# Patient Record
Sex: Female | Born: 1940 | Race: White | Hispanic: No | Marital: Married | Smoking: Former smoker
Health system: Southern US, Community
[De-identification: ages and names within clinical notes are randomized; demographics above are authoritative.]

## PROBLEM LIST (undated history)

## (undated) DIAGNOSIS — J449 Chronic obstructive pulmonary disease, unspecified: Secondary | ICD-10-CM

## (undated) DIAGNOSIS — I10 Essential (primary) hypertension: Secondary | ICD-10-CM

## (undated) HISTORY — DX: Essential (primary) hypertension: I10

---

## 2017-03-03 ENCOUNTER — Other Ambulatory Visit: Payer: Self-pay | Admitting: Internal Medicine

## 2017-03-03 DIAGNOSIS — Z1231 Encounter for screening mammogram for malignant neoplasm of breast: Secondary | ICD-10-CM

## 2017-03-11 ENCOUNTER — Ambulatory Visit (INDEPENDENT_AMBULATORY_CARE_PROVIDER_SITE_OTHER)
Admission: RE | Admit: 2017-03-11 | Discharge: 2017-03-11 | Disposition: A | Discharge status: Home / Self Care | Payer: Medicare Other | Source: Ambulatory Visit | Attending: Pulmonary Disease | Admitting: Pulmonary Disease

## 2017-03-11 ENCOUNTER — Encounter: Payer: Self-pay | Admitting: Pulmonary Disease

## 2017-03-11 ENCOUNTER — Ambulatory Visit (INDEPENDENT_AMBULATORY_CARE_PROVIDER_SITE_OTHER): Payer: Medicare Other | Admitting: Pulmonary Disease

## 2017-03-11 VITALS — BP 114/70 | HR 57 | Ht 63.0 in | Wt 162.0 lb

## 2017-03-11 DIAGNOSIS — J449 Chronic obstructive pulmonary disease, unspecified: Secondary | ICD-10-CM | POA: Diagnosis not present

## 2017-03-11 MED ORDER — ALBUTEROL SULFATE HFA 108 (90 BASE) MCG/ACT IN AERS: 2.0000 | INHALATION_SPRAY | Freq: Four times a day (QID) | RESPIRATORY_TRACT | 2 refills | Status: DC | PRN

## 2017-03-11 NOTE — Assessment & Plan Note (Signed)
Lung function is decreased at 40% Referral to pulmonary rehabilitation  Finish trelegy , then start Sample of ANORO  Albuterol MDI 2 puffs as needed before exercise  CXR today

## 2017-03-11 NOTE — Addendum Note (Signed)
Addended by: Maurene Capes on: 03/11/2017 04:12 PM   Modules accepted: Orders

## 2017-03-11 NOTE — Addendum Note (Signed)
Addended by: Maurene Capes on: 03/11/2017 11:06 AM   Modules accepted: Orders

## 2017-03-11 NOTE — Progress Notes (Signed)
Subjective:    Patient ID: Lisa Kaufman, female    DOB: 08/01/1941, 76 y.o.   MRN: [id number]  HPI  Chief Complaint  Patient presents with  . Pulm Consult    Referred by Dr. Wylene Simmer for COPD. Was given Trelegy but insurance will not pay for it. States she has SOB only she gets worried or anxious.     76 year old ex-smoker referred for evaluation of shortness of breath. She reports a diagnosis of COPD made in 2005 by her PCP in [place] where she did undergo spirometry. She reports dyspnea on exertion for many years that is stable, she states that she does not do much, she is able to walk around the house and in the grocery store but cannot climb stairs or walk for extended duration and keep up with her husband. She reports smoking a pack per day since her 46s until she quit in 2005, about 40 pack years. She reports occasional wheezing but denies frequent chest colds. She has never had an attack of the flu or pneumonia. She reports occasional dry cough but denies sputum production. She denies chest pain, orthopnea paroxysmal nocturnal dyspnea or pedal edema.  She was given a sample of trelegy by her PCP which is not covered by her insurance. She has used this for about a week and is not sure that this helps her breathing. She also takes singular at night but denies a diagnosis of asthma or denies seasonal allergies   Spirometry showed severe airway obstruction ratio 40, FEV1 of 40% and FVC of 76%    Past Medical History:  Diagnosis Date  . HTN (hypertension)    No past surgical history on file.   No Known Allergies  Social History   Social History  . Marital status: Married    Spouse name: N/A  . Number of children: N/A  . Years of education: N/A   Occupational History  . Not on file.   Social History Main Topics  . Smoking status: Former Smoker    Types: Cigarettes    Quit date: 11/18/2003  . Smokeless tobacco: Never Used  . Alcohol use No  . Drug use: No  .  Sexual activity: Not on file   Other Topics Concern  . Not on file   Social History Narrative  . No narrative on file     Review of Systems Constitutional: negative for anorexia, fevers and sweats  Eyes: negative for irritation, redness and visual disturbance  Ears, nose, mouth, throat, and face: negative for earaches, epistaxis, nasal congestion and sore throat  Respiratory: negative for cough,  sputum and wheezing  Cardiovascular: negative for chest pain,  lower extremity edema, orthopnea, palpitations and syncope  Gastrointestinal: negative for abdominal pain, constipation, diarrhea, melena, nausea and vomiting  Genitourinary:negative for dysuria, frequency and hematuria  Hematologic/lymphatic: negative for bleeding, easy bruising and lymphadenopathy  Musculoskeletal:negative for arthralgias, muscle weakness and stiff joints  Neurological: negative for coordination problems, gait problems, headaches and weakness  Endocrine: negative for diabetic symptoms including polydipsia, polyuria and weight loss     Objective:   Physical Exam  Gen. Pleasant, well-nourished, in no distress, normal affect ENT - no lesions, no post nasal drip Neck: No JVD, no thyromegaly, no carotid bruits Lungs: no use of accessory muscles, no dullness to percussion, clear without rales or rhonchi  Cardiovascular: Rhythm regular, heart sounds  normal, no murmurs or gallops, no peripheral edema Abdomen: soft and non-tender, no hepatosplenomegaly, BS normal. Musculoskeletal: No deformities, no  cyanosis or clubbing Neuro:  alert, non focal       Assessment & Plan:

## 2017-03-11 NOTE — Patient Instructions (Addendum)
Lung function is decreased at 40% Referral to pulmonary rehabilitation  Finish trelegy , then start Sample of ANORO  Albuterol MDI 2 puffs as needed before exercise  CXR today 

## 2017-03-16 ENCOUNTER — Telehealth: Payer: Self-pay | Admitting: Pulmonary Disease

## 2017-03-16 DIAGNOSIS — R0602 Shortness of breath: Secondary | ICD-10-CM

## 2017-03-16 NOTE — Telephone Encounter (Signed)
Left a message with the front staff for Lisa Kaufman to call us back

## 2017-03-16 NOTE — Telephone Encounter (Signed)
Portia from pulmonary rehab [phone], can call tomorrow..patient needs a new order with a different diagnoses, no PSTs.Charm Rings

## 2017-03-16 NOTE — Telephone Encounter (Signed)
Will call tomorrow 

## 2017-03-17 NOTE — Telephone Encounter (Signed)
Left message for Lisa Kaufman to call back.

## 2017-03-18 NOTE — Telephone Encounter (Signed)
Called pulmonary rehab and they are always off on wednesdays.  Will try back on thursday

## 2017-03-19 NOTE — Telephone Encounter (Signed)
Called and spoke to MascottePortia with pulmonary rehab. A new order is needing placed d/t pt not having PFTs, new order placed. Portia verbalized understanding and denied any further questions or concerns at this time.

## 2017-03-30 ENCOUNTER — Ambulatory Visit
Admission: RE | Admit: 2017-03-30 | Discharge: 2017-03-30 | Disposition: A | Discharge status: Home / Self Care | Payer: Medicare Other | Source: Ambulatory Visit | Attending: Internal Medicine | Admitting: Internal Medicine

## 2017-03-30 DIAGNOSIS — Z1231 Encounter for screening mammogram for malignant neoplasm of breast: Secondary | ICD-10-CM

## 2017-04-07 ENCOUNTER — Other Ambulatory Visit: Payer: Self-pay | Admitting: Internal Medicine

## 2017-04-07 DIAGNOSIS — R928 Other abnormal and inconclusive findings on diagnostic imaging of breast: Secondary | ICD-10-CM

## 2017-04-09 ENCOUNTER — Other Ambulatory Visit: Payer: Self-pay | Admitting: Internal Medicine

## 2017-04-09 ENCOUNTER — Ambulatory Visit
Admission: RE | Admit: 2017-04-09 | Discharge: 2017-04-09 | Disposition: A | Discharge status: Home / Self Care | Payer: Medicare Other | Source: Ambulatory Visit | Attending: Internal Medicine | Admitting: Internal Medicine

## 2017-04-09 DIAGNOSIS — R921 Mammographic calcification found on diagnostic imaging of breast: Secondary | ICD-10-CM

## 2017-04-09 DIAGNOSIS — R928 Other abnormal and inconclusive findings on diagnostic imaging of breast: Secondary | ICD-10-CM

## 2017-04-14 ENCOUNTER — Ambulatory Visit: Payer: Medicare Other | Admitting: Adult Health

## 2017-05-04 ENCOUNTER — Ambulatory Visit (INDEPENDENT_AMBULATORY_CARE_PROVIDER_SITE_OTHER): Payer: Medicare Other | Admitting: Adult Health

## 2017-05-04 ENCOUNTER — Encounter: Payer: Self-pay | Admitting: Adult Health

## 2017-05-04 DIAGNOSIS — J449 Chronic obstructive pulmonary disease, unspecified: Secondary | ICD-10-CM | POA: Diagnosis not present

## 2017-05-04 MED ORDER — ALBUTEROL SULFATE HFA 108 (90 BASE) MCG/ACT IN AERS: 2.0000 | INHALATION_SPRAY | Freq: Four times a day (QID) | RESPIRATORY_TRACT | 2 refills | Status: DC | PRN

## 2017-05-04 NOTE — Addendum Note (Signed)
Addended by: Boone MasterJONES, Delonta Yohannes E on: 05/04/2017 03:50 PM   Modules accepted: Orders

## 2017-05-04 NOTE — Progress Notes (Signed)
@Patient  ID: Lisa Kaufman, female    DOB: [birth year], 76 y.o.   MRN: [id number]  Chief Complaint  Patient presents with  . Follow-up    COPD     Referring provider: No ref. provider found  HPI: 76 year old female former smoker followed for COPD seen for pulmonary consult 03/11/2017  TEST  02/2017 >Spirometry showed severe airway obstruction ratio 40, FEV1 of 40% and FVC of 76%  05/04/2017 Follow up : COPD  Pt returns for 2 month follow up . Seen last ov to establish for COPD . Spirometry showed Severe COPD with FEV1 40% , ratio 40 .  She feels she is doing okay . No flare of cough or wheezing . Gets winded with activiies and has to rest sometimes.   She remains on TRELEGY . Was going to change to [facility] - does not think it was covered.  CXR showed COPD changes.  She was referred to pulmonary rehab. She could not afford the co-pays .  We discussed healthy activities.   She is on ACE inhibitor . Does have raspy voice and dry cough intermittently .   PVX Hulen Luster are utd.    No Known Allergies  Immunization History  Administered Date(s) Administered  . Influenza Whole 08/17/2016    Past Medical History:  Diagnosis Date  . HTN (hypertension)     Tobacco History: History  Smoking Status  . Former Smoker  . Types: Cigarettes  . Quit date: 11/18/2003  Smokeless Tobacco  . Never Used   Counseling given: Not Answered   Outpatient Encounter Prescriptions as of 05/04/2017  Medication Sig  . Fluticasone-Umeclidin-Vilant (TRELEGY ELLIPTA) 100-62.5-25 MCG/INH AEPB Inhale 1 puff into the lungs.  Marland Kitchen lisinopril-hydrochlorothiazide (PRINZIDE,ZESTORETIC) 20-12.5 MG tablet Take 1 tablet by mouth daily.  . montelukast (SINGULAIR) 10 MG tablet 1 tablet.  Marland Kitchen albuterol (PROVENTIL HFA;VENTOLIN HFA) 108 (90 Base) MCG/ACT inhaler Inhale 2 puffs into the lungs every 6 (six) hours as needed for wheezing or shortness of breath. (Patient not taking: Reported on 05/04/2017)  . alendronate  (FOSAMAX) 70 MG tablet 1 tablet.   No facility-administered encounter medications on file as of 05/04/2017.      Review of Systems  Constitutional:   No  weight loss, night sweats,  Fevers, chills, fatigue, or  lassitude.  HEENT:   No headaches,  Difficulty swallowing,  Tooth/dental problems, or  Sore throat,                No sneezing, itching, ear ache, nasal congestion, post nasal drip,   CV:  No chest pain,  Orthopnea, PND, swelling in lower extremities, anasarca, dizziness, palpitations, syncope.   GI  No heartburn, indigestion, abdominal pain, nausea, vomiting, diarrhea, change in bowel habits, loss of appetite, bloody stools.   Resp:    No chest wall deformity  Skin: no rash or lesions.  GU: no dysuria, change in color of urine, no urgency or frequency.  No flank pain, no hematuria   MS:  No joint pain or swelling.  No decreased range of motion.  No back pain.    Physical Exam  BP 114/70 (BP Location: Left Arm, Cuff Size: Normal)   Pulse 63   Ht 5\' 1"  (1.549 m)   Wt 131 lb (59.4 kg)   SpO2 95%   BMI 24.75 kg/m   GEN: A/Ox3; pleasant , NAD, elderly    HEENT:  [place]/AT,  EACs-clear, TMs-wnl, NOSE-clear, THROAT-clear, no lesions, no postnasal drip or exudate noted.   NECK:  Supple w/ fair ROM; no JVD; normal carotid impulses w/o bruits; no thyromegaly or nodules palpated; no lymphadenopathy.    RESP  Decreased BS in bases ,  no accessory muscle use, no dullness to percussion  CARD:  RRR, no m/r/g, no peripheral edema, pulses intact, no cyanosis or clubbing.  GI:   Soft & nt; nml bowel sounds; no organomegaly or masses detected.   Musco: Warm bil, no deformities or joint swelling noted.   Neuro: alert, no focal deficits noted.    Skin: Warm, no lesions or rashes    Lab Results:  CBC No results found for: WBC, RBC, HGB, HCT, PLT, MCV, MCH, MCHC, RDW, LYMPHSABS, MONOABS, EOSABS, BASOSABS  BMET No results found for: NA, K, CL, CO2, GLUCOSE, BUN, CREATININE,  CALCIUM, GFRNONAA, GFRAA  BNP No results found for: BNP  ProBNP No results found for: PROBNP  Imaging: Mm Digital Diagnostic Unilat L  Result Date: 04/09/2017 CLINICAL DATA:  Screening recall for left breast calcifications. EXAM: DIGITAL DIAGNOSTIC LEFT MAMMOGRAM COMPARISON:  Previous exam(s). ACR Breast Density Category b: There are scattered areas of fibroglandular density. FINDINGS: Spot compression magnification views were performed over the upper-outer posterior left breast demonstrating a 3-4 group of coarse likely early dystrophic type calcifications. IMPRESSION: Probably benign left breast calcifications. RECOMMENDATION: Diagnostic mammography of the left breast with magnification views in 6 months. I have discussed the findings and recommendations with the patient. Results were also provided in writing at the conclusion of the visit. If applicable, a reminder letter will be sent to the patient regarding the next appointment. BI-RADS CATEGORY  3: Probably benign. Electronically Signed   By: Edwin CapJennifer  Jarosz M.D.   On: 04/09/2017 10:41     Assessment & Plan:   COPD (chronic obstructive pulmonary disease) (HCC) Severe COPD , compensated on TRELEGY  Pulmonary rehab would be beneficial but uanble to afford.  Advised on healthy activities.  PvX and Prevnar vaccines utd.  She does have intermittent cough , would use caution with ACE inhibitor if cough flares.   Plan  Patient Instructions  Continue on TRELEGY 1 puff daily , rinse after use.  Discuss with Primary MD that Lisinopril may aggravate your cough.  Use Ventolin 2 puffs every 4hrs as needed or before exercise as needed- this is your rescue inhaler.  Follow up Dr. Vassie LollAlva  In 4-6 months and As needed   Please contact office for sooner follow up if symptoms do not improve or worsen or seek emergency care         Rubye Oaksammy Jadae Steinke, NP 05/04/2017

## 2017-05-04 NOTE — Assessment & Plan Note (Addendum)
Severe COPD , compensated on TRELEGY  Pulmonary rehab would be beneficial but uanble to afford.  Advised on healthy activities.  PvX and Prevnar vaccines utd.  She does have intermittent cough , would use caution with ACE inhibitor if cough flares.   Plan  Patient Instructions  Continue on TRELEGY 1 puff daily , rinse after use.  Discuss with Primary MD that Lisinopril may aggravate your cough.  Use Ventolin 2 puffs every 4hrs as needed or before exercise as needed- this is your rescue inhaler.  Follow up Dr. Vassie LollAlva  In 4-6 months and As needed   Please contact office for sooner follow up if symptoms do not improve or worsen or seek emergency care

## 2017-05-04 NOTE — Patient Instructions (Signed)
Continue on TRELEGY 1 puff daily , rinse after use.  Discuss with Primary MD that Lisinopril may aggravate your cough.  Use Ventolin 2 puffs every 4hrs as needed or before exercise as needed- this is your rescue inhaler.  Follow up Dr. Vassie LollAlva  In 4-6 months and As needed   Please contact office for sooner follow up if symptoms do not improve or worsen or seek emergency care

## 2017-05-11 NOTE — Progress Notes (Signed)
Reviewed & agree with plan  

## 2017-09-07 ENCOUNTER — Ambulatory Visit: Payer: Medicare Other | Admitting: Pulmonary Disease

## 2017-09-14 ENCOUNTER — Ambulatory Visit (INDEPENDENT_AMBULATORY_CARE_PROVIDER_SITE_OTHER): Payer: Medicare Other | Admitting: Pulmonary Disease

## 2017-09-14 ENCOUNTER — Encounter: Payer: Self-pay | Admitting: Pulmonary Disease

## 2017-09-14 DIAGNOSIS — J449 Chronic obstructive pulmonary disease, unspecified: Secondary | ICD-10-CM

## 2017-09-14 DIAGNOSIS — I10 Essential (primary) hypertension: Secondary | ICD-10-CM

## 2017-09-14 MED ORDER — FLUTICASONE-UMECLIDIN-VILANT 100-62.5-25 MCG/INH IN AEPB: 1.0000 | INHALATION_SPRAY | Freq: Every day | RESPIRATORY_TRACT | 0 refills | Status: DC

## 2017-09-14 NOTE — Progress Notes (Signed)
   Subjective:    Patient ID: Lisa Kaufman, female    DOB: 1941-03-10, 76 y.o.   MRN: [id number]  HPI 76 year old female former smoker followed for COPD   She remains on trelegy but is in the donut hole and this is very expensive. She was unable to afford rehab due to high co-pays  Breathing remains bad and she can feel it when she has to walk even on level ground for long distances She continues to have throat clearing, but a week ago lisinopril was stopped and changed to losartan  Significant tests/ events reviewed  02/2017 >Spirometry showed severe airway obstruction ratio 40, FEV1 of 40% and FVC of 76%   Past Medical History:  Diagnosis Date  . HTN (hypertension)      Review of Systems neg for any significant sore throat, dysphagia, itching, sneezing, nasal congestion or excess/ purulent secretions, fever, chills, sweats, unintended wt loss, pleuritic or exertional cp, hempoptysis, orthopnea pnd or change in chronic leg swelling. Also denies presyncope, palpitations, heartburn, abdominal pain, nausea, vomiting, diarrhea or change in bowel or urinary habits, dysuria,hematuria, rash, arthralgias, visual complaints, headache, numbness weakness or ataxia.     Objective:   Physical Exam  Gen. Pleasant, well-nourished, in no distress ENT - no thrush, no post nasal drip Neck: No JVD, no thyromegaly, no carotid bruits Lungs: no use of accessory muscles, no dullness to percussion, decreased  without rales or rhonchi  Cardiovascular: Rhythm regular, heart sounds  normal, no murmurs or gallops, no peripheral edema Musculoskeletal: No deformities, no cyanosis or clubbing        Assessment & Plan:

## 2017-09-14 NOTE — Assessment & Plan Note (Signed)
Changed to losartan 1 week ago

## 2017-09-14 NOTE — Addendum Note (Signed)
Addended by: Maurene CapesPOTTS, Ritu Gagliardo M on: 09/14/2017 04:39 PM   Modules accepted: Orders

## 2017-09-14 NOTE — Assessment & Plan Note (Signed)
Ct Trelegy  Walking for 30 mins daily Call if you develop  -change in color of sputum. -increased wheezing - increased use of albuterol

## 2017-09-14 NOTE — Patient Instructions (Signed)
Walking for 30 mins daily Call if you develop  -change in color of sputum. -increased wheezing - increased use of albuterol

## 2017-10-12 ENCOUNTER — Ambulatory Visit
Admission: RE | Admit: 2017-10-12 | Discharge: 2017-10-12 | Disposition: A | Discharge status: Home / Self Care | Payer: Medicare Other | Source: Ambulatory Visit | Attending: Internal Medicine | Admitting: Internal Medicine

## 2017-10-12 ENCOUNTER — Other Ambulatory Visit: Payer: Self-pay | Admitting: Internal Medicine

## 2017-10-12 DIAGNOSIS — R921 Mammographic calcification found on diagnostic imaging of breast: Secondary | ICD-10-CM

## 2017-11-23 DIAGNOSIS — H5213 Myopia, bilateral: Secondary | ICD-10-CM | POA: Diagnosis not present

## 2017-11-23 DIAGNOSIS — H2513 Age-related nuclear cataract, bilateral: Secondary | ICD-10-CM | POA: Diagnosis not present

## 2017-12-09 DIAGNOSIS — H2511 Age-related nuclear cataract, right eye: Secondary | ICD-10-CM | POA: Diagnosis not present

## 2017-12-09 DIAGNOSIS — H2512 Age-related nuclear cataract, left eye: Secondary | ICD-10-CM | POA: Diagnosis not present

## 2017-12-09 DIAGNOSIS — H25812 Combined forms of age-related cataract, left eye: Secondary | ICD-10-CM | POA: Diagnosis not present

## 2017-12-23 DIAGNOSIS — H2511 Age-related nuclear cataract, right eye: Secondary | ICD-10-CM | POA: Diagnosis not present

## 2017-12-23 DIAGNOSIS — H25811 Combined forms of age-related cataract, right eye: Secondary | ICD-10-CM | POA: Diagnosis not present

## 2017-12-23 DIAGNOSIS — H2512 Age-related nuclear cataract, left eye: Secondary | ICD-10-CM | POA: Diagnosis not present

## 2018-01-13 DIAGNOSIS — M25552 Pain in left hip: Secondary | ICD-10-CM | POA: Diagnosis not present

## 2018-01-13 DIAGNOSIS — J449 Chronic obstructive pulmonary disease, unspecified: Secondary | ICD-10-CM | POA: Diagnosis not present

## 2018-01-13 DIAGNOSIS — Z6826 Body mass index (BMI) 26.0-26.9, adult: Secondary | ICD-10-CM | POA: Diagnosis not present

## 2018-01-13 DIAGNOSIS — I10 Essential (primary) hypertension: Secondary | ICD-10-CM | POA: Diagnosis not present

## 2018-01-21 DIAGNOSIS — M25552 Pain in left hip: Secondary | ICD-10-CM | POA: Diagnosis not present

## 2018-01-21 DIAGNOSIS — M533 Sacrococcygeal disorders, not elsewhere classified: Secondary | ICD-10-CM | POA: Diagnosis not present

## 2018-01-27 DIAGNOSIS — M533 Sacrococcygeal disorders, not elsewhere classified: Secondary | ICD-10-CM | POA: Diagnosis not present

## 2018-03-04 DIAGNOSIS — I10 Essential (primary) hypertension: Secondary | ICD-10-CM | POA: Diagnosis not present

## 2018-03-04 DIAGNOSIS — R82998 Other abnormal findings in urine: Secondary | ICD-10-CM | POA: Diagnosis not present

## 2018-03-12 DIAGNOSIS — Z6826 Body mass index (BMI) 26.0-26.9, adult: Secondary | ICD-10-CM | POA: Diagnosis not present

## 2018-03-12 DIAGNOSIS — E7800 Pure hypercholesterolemia, unspecified: Secondary | ICD-10-CM | POA: Diagnosis not present

## 2018-03-12 DIAGNOSIS — Z1389 Encounter for screening for other disorder: Secondary | ICD-10-CM | POA: Diagnosis not present

## 2018-03-12 DIAGNOSIS — I700 Atherosclerosis of aorta: Secondary | ICD-10-CM | POA: Diagnosis not present

## 2018-03-12 DIAGNOSIS — J449 Chronic obstructive pulmonary disease, unspecified: Secondary | ICD-10-CM | POA: Diagnosis not present

## 2018-03-12 DIAGNOSIS — M25552 Pain in left hip: Secondary | ICD-10-CM | POA: Diagnosis not present

## 2018-03-12 DIAGNOSIS — Z0000 Encounter for general adult medical examination without abnormal findings: Secondary | ICD-10-CM | POA: Diagnosis not present

## 2018-03-12 DIAGNOSIS — D692 Other nonthrombocytopenic purpura: Secondary | ICD-10-CM | POA: Diagnosis not present

## 2018-03-12 DIAGNOSIS — I10 Essential (primary) hypertension: Secondary | ICD-10-CM | POA: Diagnosis not present

## 2018-03-15 ENCOUNTER — Encounter: Payer: Self-pay | Admitting: Adult Health

## 2018-03-15 ENCOUNTER — Ambulatory Visit: Payer: Medicare HMO | Admitting: Adult Health

## 2018-03-15 ENCOUNTER — Other Ambulatory Visit: Payer: Medicare HMO

## 2018-03-15 VITALS — BP 114/64 | HR 68 | Ht 61.0 in | Wt 139.6 lb

## 2018-03-15 DIAGNOSIS — R0789 Other chest pain: Secondary | ICD-10-CM | POA: Diagnosis not present

## 2018-03-15 DIAGNOSIS — J301 Allergic rhinitis due to pollen: Secondary | ICD-10-CM | POA: Diagnosis not present

## 2018-03-15 DIAGNOSIS — J449 Chronic obstructive pulmonary disease, unspecified: Secondary | ICD-10-CM

## 2018-03-15 DIAGNOSIS — J309 Allergic rhinitis, unspecified: Secondary | ICD-10-CM | POA: Insufficient documentation

## 2018-03-15 NOTE — Progress Notes (Signed)
  ID: Lisa Kaufman, female    DOB: 24-Oct-1941, 77 y.o.   MRN: [id number]  Chief Complaint  Patient presents with  . Follow-up    COPD    Referring provider: Gaspar Garbe, MD  HPI: 77 year old female former smoker (quit 2005 ) followed for COPD seen for pulmonary consult 03/11/2017  TEST /Events 02/2017 >Spirometry showed severe airway obstruction ratio 40, FEV1 of 40% and FVC of 76% Unable to afford pulm rehab  Prevnar utd., PVX utd - ? Date    03/15/2018 Follow up : COPD  Pt returns for 6 month follow up for severe COPD . She remains on TRELEGY . Says she is doing okay overall. No flare of cough or wheezing . Tries to stay active ,does a lot of walking . Going to [place] soon, wants to ride bikes.  Has strong family history of COPD and Emphysema .   Does have allergic rhinitis . Takes Singulair daily . Says she is doing well with pollen. No flare of nasal draingae.   Wants referral to cardiology as sister has had 3 stents . Wants to be checked out to make sure her heart is okay. Denies chest pain. Says has occasional chest pain that comes and goes quickly.    No Known Allergies  Immunization History  Administered Date(s) Administered  . Influenza Whole 08/17/2016  . Influenza, High Dose Seasonal PF 08/17/2017  . Pneumococcal Polysaccharide-23 03/15/2014    Past Medical History:  Diagnosis Date  . HTN (hypertension)     Tobacco History: Social History   Tobacco Use  Smoking Status Former Smoker  . Types: Cigarettes  . Last attempt to quit: 11/18/2003  . Years since quitting: 14.3  Smokeless Tobacco Never Used   Counseling given: Not Answered   Outpatient Encounter Medications as of 03/15/2018  Medication Sig  . albuterol (PROVENTIL HFA;VENTOLIN HFA) 108 (90 Base) MCG/ACT inhaler Inhale 2 puffs into the lungs every 6 (six) hours as needed for wheezing or shortness of breath.  . Fluticasone-Umeclidin-Vilant (TRELEGY ELLIPTA) 100-62.5-25  MCG/INH AEPB Inhale 1 puff into the lungs.  Marland Kitchen losartan-hydrochlorothiazide (HYZAAR) 100-25 MG tablet Take 1 tablet by mouth daily.  . montelukast (SINGULAIR) 10 MG tablet Take 10 mg by mouth at bedtime.   . [DISCONTINUED] alendronate (FOSAMAX) 70 MG tablet 1 tablet.  . [DISCONTINUED] Fluticasone-Umeclidin-Vilant (TRELEGY ELLIPTA) 100-62.5-25 MCG/INH AEPB Inhale 1 puff into the lungs daily. (Patient not taking: Reported on 03/15/2018)   No facility-administered encounter medications on file as of 03/15/2018.      Review of Systems  Constitutional:   No  weight loss, night sweats,  Fevers, chills, fatigue, or  lassitude.  HEENT:   No headaches,  Difficulty swallowing,  Tooth/dental problems, or  Sore throat,                No sneezing, itching, ear ache, nasal congestion, post nasal drip,   CV:  No chest pain,  Orthopnea, PND, swelling in lower extremities, anasarca, dizziness, palpitations, syncope.   GI  No heartburn, indigestion, abdominal pain, nausea, vomiting, diarrhea, change in bowel habits, loss of appetite, bloody stools.   Resp:  No excess mucus, no productive cough,  No non-productive cough,  No coughing up of blood.  No change in color of mucus.  No wheezing.  No chest wall deformity  Skin: no rash or lesions.  GU: no dysuria, change in color of urine, no urgency or frequency.  No flank pain, no hematuria   MS:  No  joint pain or swelling.  No decreased range of motion.  No back pain.    Physical Exam  BP 114/64 (BP Location: Left Arm, Cuff Size: Normal)   Pulse 68   Ht  (1.549 m)   Wt 139 lb 9.6 oz (63.3 kg)   SpO2 100%   BMI 26.38 kg/m   GEN: A/Ox3; pleasant , NAD, well nourished    HEENT:  [place]/AT,  EACs-clear, TMs-wnl, NOSE-clear, THROAT-clear, no lesions, no postnasal drip or exudate noted.   NECK:  Supple w/ fair ROM; no JVD; normal carotid impulses w/o bruits; no thyromegaly or nodules palpated; no lymphadenopathy.    RESP  Clear  P & A; w/o, wheezes/  rales/ or rhonchi. no accessory muscle use, no dullness to percussion  CARD:  RRR, no m/r/g, no peripheral edema, pulses intact, no cyanosis or clubbing.  GI:   Soft & nt; nml bowel sounds; no organomegaly or masses detected.   Musco: Warm bil, no deformities or joint swelling noted.   Neuro: alert, no focal deficits noted.    Skin: Warm, no lesions or rashes    Lab Results:  CBC No results found for: WBC, RBC, HGB, HCT, PLT, MCV, MCH, MCHC, RDW, LYMPHSABS, MONOABS, EOSABS, BASOSABS  BMET No results found for: NA, K, CL, CO2, GLUCOSE, BUN, CREATININE, CALCIUM, GFRNONAA, GFRAA  BNP No results found for: BNP  ProBNP No results found for: PROBNP  Imaging: No results found.   Assessment & Plan:   COPD (chronic obstructive pulmonary disease) (HCC) Controlled on TRELEGY  Check alpha 1 antitrypsin   Plan  Patient Instructions  Continue on TRELEGY 1 puff daily , rinse after use.  Use Ventolin 2 puffs every 4hrs as needed or before exercise as needed- this is your rescue inhaler.  Labs today with Alpha 1 .  Follow up Dr. Vassie Loll  In 6 months and As needed   Please contact office for sooner follow up if symptoms do not improve or worsen or seek emergency care       Allergic rhinitis Controlled on singulair   Chest tightness Strong family hx of CAD . Intermittent episodes of chest tightness , none recently . Wants referral to cards   Plan  Refer to cards.      Rubye Oaks, NP 03/15/2018

## 2018-03-15 NOTE — Assessment & Plan Note (Signed)
Controlled on singulair.   

## 2018-03-15 NOTE — Assessment & Plan Note (Addendum)
Controlled on TRELEGY  Check alpha 1 antitrypsin   Plan  Patient Instructions  Continue on TRELEGY 1 puff daily , rinse after use.  Use Ventolin 2 puffs every 4hrs as needed or before exercise as needed- this is your rescue inhaler.  Labs today with Alpha 1 .  Follow up Dr. Vassie Loll  In 6 months and As needed   Please contact office for sooner follow up if symptoms do not improve or worsen or seek emergency care

## 2018-03-15 NOTE — Addendum Note (Signed)
Addended by: Boone Master E on: 03/15/2018 10:57 AM   Modules accepted: Orders

## 2018-03-15 NOTE — Patient Instructions (Addendum)
Continue on TRELEGY 1 puff daily , rinse after use.  Use Ventolin 2 puffs every 4hrs as needed or before exercise as needed- this is your rescue inhaler.  Labs today with Alpha 1 .  Follow up Dr. Vassie Loll  In 6 months and As needed   Please contact office for sooner follow up if symptoms do not improve or worsen or seek emergency care

## 2018-03-15 NOTE — Assessment & Plan Note (Signed)
Strong family hx of CAD . Intermittent episodes of chest tightness , none recently . Wants referral to cards   Plan  Refer to cards.

## 2018-03-17 NOTE — Progress Notes (Signed)
Reviewed & agree with plan  

## 2018-03-18 LAB — ALPHA-1 ANTITRYPSIN PHENOTYPE: A1 ANTITRYPSIN SER: 137 mg/dL (ref 83–199)

## 2018-03-23 ENCOUNTER — Encounter: Payer: Self-pay | Admitting: Internal Medicine

## 2018-03-23 ENCOUNTER — Ambulatory Visit: Payer: Medicare HMO | Admitting: Internal Medicine

## 2018-03-23 VITALS — BP 110/62 | HR 67 | Ht 61.0 in | Wt 136.4 lb

## 2018-03-23 DIAGNOSIS — Z8249 Family history of ischemic heart disease and other diseases of the circulatory system: Secondary | ICD-10-CM | POA: Diagnosis not present

## 2018-03-23 DIAGNOSIS — J449 Chronic obstructive pulmonary disease, unspecified: Secondary | ICD-10-CM | POA: Diagnosis not present

## 2018-03-23 DIAGNOSIS — E785 Hyperlipidemia, unspecified: Secondary | ICD-10-CM | POA: Diagnosis not present

## 2018-03-23 DIAGNOSIS — R0602 Shortness of breath: Secondary | ICD-10-CM

## 2018-03-23 DIAGNOSIS — R079 Chest pain, unspecified: Secondary | ICD-10-CM | POA: Diagnosis not present

## 2018-03-23 MED ORDER — ATORVASTATIN CALCIUM 40 MG PO TABS: 40.0000 mg | ORAL_TABLET | Freq: Every day | ORAL | 3 refills | Status: DC

## 2018-03-23 NOTE — Patient Instructions (Signed)
Medication Instructions:   START atorvastatin  once daily for cholesterol  Labwork:  FASTING lab work in 3 month to check cholesterol  Testing/Procedures:  Dr. Rennis Golden has ordered a Lexiscan Myocardial Perfusion Imaging Study.  Please arrive 15 minutes prior to your appointment time for registration and insurance purposes.   The test will take approximately 3 to 4 hours to complete; you may bring reading material.  If someone comes with you to your appointment, they will need to remain in the main lobby due to limited space in the testing area.    How to prepare for your Myocardial Perfusion Test:  Do not eat or drink 3 hours prior to your test, except you may have water.  Do not consume products containing caffeine (regular or decaffeinated) 12 hours prior to your test. (ex: coffee, chocolate, sodas, tea).  Do wear comfortable clothes (no dresses or overalls) and walking shoes, tennis shoes preferred (No heels or open toe shoes are allowed).  Do NOT wear cologne, perfume, aftershave, or lotions (deodorant is allowed).  If you use an inhaler, use it the AM of your test and bring it with you.   If you use a nebulizer, use it the AM of your test.   If these instructions are not followed, your test will have to be rescheduled.  Follow-Up:  Your physician recommends that you schedule a follow-up appointment with Dr. Rennis Golden after your testing.   If you need a refill on your cardiac medications before your next appointment, please call your pharmacy.  Any Other Special Instructions Will Be Listed Below (If Applicable).

## 2018-03-24 ENCOUNTER — Telehealth (HOSPITAL_COMMUNITY): Payer: Self-pay

## 2018-03-24 ENCOUNTER — Encounter: Payer: Self-pay | Admitting: Internal Medicine

## 2018-03-24 DIAGNOSIS — E785 Hyperlipidemia, unspecified: Secondary | ICD-10-CM | POA: Insufficient documentation

## 2018-03-24 DIAGNOSIS — R079 Chest pain, unspecified: Secondary | ICD-10-CM | POA: Insufficient documentation

## 2018-03-24 DIAGNOSIS — R0602 Shortness of breath: Secondary | ICD-10-CM | POA: Insufficient documentation

## 2018-03-24 DIAGNOSIS — Z8249 Family history of ischemic heart disease and other diseases of the circulatory system: Secondary | ICD-10-CM | POA: Insufficient documentation

## 2018-03-24 NOTE — Progress Notes (Signed)
OFFICE CONSULT NOTE  Chief Complaint:  Chest tightness  Primary Care Physician: Tisovec, Adelfa Koh, MD  HPI:  Lisa Kaufman is a 77 y.o. female who is being seen today for the evaluation of chest tightness at the request of Tisovec, Adelfa Koh, MD.  This is a pleasant 77 year old female with a history of COPD followed by Lisa Mess, NP, who also sees Dr. Wylene Kaufman for primary care.  She is here today for evaluation of chest tightness and shortness of breath.  She also has a history of hypertension.  She was a smoker but quit 14 years ago.  She has a sister who has had 3 stents.  She reports her tightness is worse with exertion and relieved by rest.  Sometimes worse laying down.  She reports good sleep at night and had a low sleepiness score of 1.  Recent labs from March 04, 2018 showed total cholesterol 218, HDL 60, LDL 142 and triglycerides 81.  She is not on statin therapy.  Despite being on a stable inhaler does she reports some worsening of her symptoms which could suggest ischemia.  EKG shows sinus rhythm at 67 with nonspecific ST and T wave changes.  PMHx:  Past Medical History:  Diagnosis Date  . HTN (hypertension)     No past surgical history on file.  FAMHx:  Family History  Problem Relation Age of Onset  . COPD Mother   . Heart failure Mother   . CAD Sister   . Heart disease Maternal Grandmother   . Cancer Maternal Grandfather   . COPD Paternal Grandfather      SOCHx:   reports that she quit smoking about 14 years ago. Her smoking use included cigarettes. She has never used smokeless tobacco. She reports that she does not drink alcohol or use drugs.  ALLERGIES:  No Known Allergies  ROS: Pertinent items noted in HPI and remainder of comprehensive ROS otherwise negative.  HOME MEDS: Current Outpatient Medications on File Prior to Visit  Medication Sig Dispense Refill  . albuterol (PROVENTIL HFA;VENTOLIN HFA) 108 (90 Base) MCG/ACT inhaler Inhale 2 puffs into  the lungs every 6 (six) hours as needed for wheezing or shortness of breath. 1 Inhaler 2  . Fluticasone-Umeclidin-Vilant (TRELEGY ELLIPTA) 100-62.5-25 MCG/INH AEPB Inhale 1 puff into the lungs.    Marland Kitchen losartan-hydrochlorothiazide (HYZAAR) 100-25 MG tablet Take 1 tablet by mouth daily.    . montelukast (SINGULAIR) 10 MG tablet Take 10 mg by mouth at bedtime.   0   No current facility-administered medications on file prior to visit.     LABS/IMAGING: No results found for this or any previous visit (from the past 48 hour(s)). No results found.  LIPID PANEL: No results found for: CHOL, TRIG, HDL, CHOLHDL, VLDL, LDLCALC, LDLDIRECT  WEIGHTS: Wt Readings from Last 3 Encounters:  03/23/18 136 lb 6.4 oz (61.9 kg)  03/15/18 139 lb 9.6 oz (63.3 kg)  09/14/17 133 lb (60.3 kg)    VITALS: BP 110/62   Pulse 67   Ht  (1.549 m)   Wt 136 lb 6.4 oz (61.9 kg)   BMI 25.77 kg/m   EXAM: General appearance: alert and no distress Neck: no carotid bruit, no JVD and thyroid not enlarged, symmetric, no tenderness/mass/nodules Lungs: diminished breath sounds bilaterally Heart: regular rate and rhythm Abdomen: soft, non-tender; bowel sounds normal; no masses,  no organomegaly Extremities: extremities normal, atraumatic, no cyanosis or edema Pulses: 2+ and symmetric Skin: Skin color, texture, turgor normal. No rashes  or lesions Neurologic: Grossly normal Psych: Pleasant  EKG: Normal sinus rhythm at 67, nonspecific ST and T wave changes-- personally reviewed  ASSESSMENT: 1. Progressive chest pain and dyspnea-possibly angina 2. Abnormal EKG with nonspecific ST and T wave changes 3. COPD 4. Family history of premature coronary disease in her sister 43. Uncontrolled dyslipidemia  PLAN: 1.  Lisa Kaufman is describing chest pain and dyspnea which could be angina.  She does have COPD but this is been fairly stable.  She has a strong family history of heart disease, particularly in her sister which  puts her at increased risk and abnormalities on her EKG.  She is not able to exercise significantly to do a treadmill stress test.  I would recommend a Lexiscan Myoview.  In addition we should start high intensity statin therapy for a goal LDL less than 70.  I recommend atorvastatin 40 mg.  Follow-up with me afterwards.  Thanks again for the kind referral.  Lisa Nose, MD, [facility]  [place]  [facility]  Medical Director of the Advanced Lipid Disorders &  Cardiovascular Risk Reduction Clinic Diplomate of the American Board of Clinical Lipidology Attending Cardiologist  Direct Dial: [phone]  Fax: [phone]  Website:  [email or web address] Lisa Kaufman 03/24/2018, 6:25 PM

## 2018-03-24 NOTE — Telephone Encounter (Signed)
Encounter complete. 

## 2018-03-25 ENCOUNTER — Telehealth (HOSPITAL_COMMUNITY): Payer: Self-pay

## 2018-03-25 ENCOUNTER — Ambulatory Visit (HOSPITAL_COMMUNITY)
Admission: RE | Admit: 2018-03-25 | Discharge: 2018-03-25 | Disposition: A | Discharge status: Home / Self Care | Payer: Medicare HMO | Source: Ambulatory Visit | Attending: Internal Medicine | Admitting: Internal Medicine

## 2018-03-25 DIAGNOSIS — R0602 Shortness of breath: Secondary | ICD-10-CM | POA: Diagnosis not present

## 2018-03-25 DIAGNOSIS — E785 Hyperlipidemia, unspecified: Secondary | ICD-10-CM

## 2018-03-25 DIAGNOSIS — I10 Essential (primary) hypertension: Secondary | ICD-10-CM | POA: Diagnosis not present

## 2018-03-25 DIAGNOSIS — Z87891 Personal history of nicotine dependence: Secondary | ICD-10-CM | POA: Insufficient documentation

## 2018-03-25 DIAGNOSIS — J449 Chronic obstructive pulmonary disease, unspecified: Secondary | ICD-10-CM | POA: Diagnosis not present

## 2018-03-25 DIAGNOSIS — R079 Chest pain, unspecified: Secondary | ICD-10-CM

## 2018-03-25 DIAGNOSIS — Z8249 Family history of ischemic heart disease and other diseases of the circulatory system: Secondary | ICD-10-CM

## 2018-03-25 LAB — MYOCARDIAL PERFUSION IMAGING
CHL CUP NUCLEAR SSS: 10
LVDIAVOL: 50 mL (ref 46–106)
LVSYSVOL: 10 mL
Peak HR: 114 {beats}/min
Rest HR: 63 {beats}/min
SDS: 6
SRS: 4
TID: 0.68

## 2018-03-25 MED ORDER — REGADENOSON 0.4 MG/5ML IV SOLN
0.4000 mg | Freq: Once | INTRAVENOUS | Status: AC
  Administered 2018-03-25: 0.4 mg via INTRAVENOUS

## 2018-03-25 MED ORDER — TECHNETIUM TC 99M TETROFOSMIN IV KIT
10.3000 | PACK | Freq: Once | INTRAVENOUS | Status: AC | PRN
  Administered 2018-03-25: 10.3 via INTRAVENOUS
  Filled 2018-03-25: qty 11

## 2018-03-25 MED ORDER — TECHNETIUM TC 99M TETROFOSMIN IV KIT
30.9000 | PACK | Freq: Once | INTRAVENOUS | Status: AC | PRN
  Administered 2018-03-25: 30.9 via INTRAVENOUS
  Filled 2018-03-25: qty 31

## 2018-03-25 NOTE — Telephone Encounter (Signed)
End encounter

## 2018-03-28 ENCOUNTER — Other Ambulatory Visit: Payer: Self-pay | Admitting: Pulmonary Disease

## 2018-05-11 ENCOUNTER — Ambulatory Visit
Admission: RE | Admit: 2018-05-11 | Discharge: 2018-05-11 | Disposition: A | Discharge status: Home / Self Care | Payer: Medicare HMO | Source: Ambulatory Visit | Attending: Internal Medicine | Admitting: Internal Medicine

## 2018-05-11 DIAGNOSIS — R921 Mammographic calcification found on diagnostic imaging of breast: Secondary | ICD-10-CM

## 2018-06-02 DIAGNOSIS — E785 Hyperlipidemia, unspecified: Secondary | ICD-10-CM | POA: Diagnosis not present

## 2018-06-02 LAB — LIPID PANEL
CHOL/HDL RATIO: 2.9 ratio (ref 0.0–4.4)
CHOLESTEROL TOTAL: 223 mg/dL — AB (ref 100–199)
HDL: 78 mg/dL (ref >39)
LDL Calculated: 128 mg/dL — ABNORMAL HIGH (ref 0–99)
TRIGLYCERIDES: 84 mg/dL (ref 0–149)
VLDL Cholesterol Cal: 17 mg/dL (ref 5–40)

## 2018-06-10 ENCOUNTER — Encounter: Payer: Self-pay | Admitting: Internal Medicine

## 2018-06-10 ENCOUNTER — Ambulatory Visit: Payer: Medicare HMO | Admitting: Internal Medicine

## 2018-06-10 VITALS — BP 118/78 | HR 55 | Ht 61.0 in | Wt 137.4 lb

## 2018-06-10 DIAGNOSIS — E785 Hyperlipidemia, unspecified: Secondary | ICD-10-CM

## 2018-06-10 DIAGNOSIS — R0602 Shortness of breath: Secondary | ICD-10-CM | POA: Diagnosis not present

## 2018-06-10 DIAGNOSIS — R079 Chest pain, unspecified: Secondary | ICD-10-CM | POA: Diagnosis not present

## 2018-06-10 DIAGNOSIS — H04123 Dry eye syndrome of bilateral lacrimal glands: Secondary | ICD-10-CM | POA: Diagnosis not present

## 2018-06-10 NOTE — Progress Notes (Addendum)
OFFICE CONSULT NOTE  Chief Complaint:  Chest tightness  Primary Care Physician: Tisovec, Adelfa Koh, MD  HPI:  Lisa Kaufman is a 77 y.o. female who is being seen today for the evaluation of chest tightness at the request of Tisovec, Adelfa Koh, MD.  This is a pleasant 77 year old female with a history of COPD followed by Blase Mess, NP, who also sees Dr. Wylene Simmer for primary care.  She is here today for evaluation of chest tightness and shortness of breath.  She also has a history of hypertension.  She was a smoker but quit 14 years ago.  She has a sister who has had 3 stents.  She reports her tightness is worse with exertion and relieved by rest.  Sometimes worse laying down.  She reports good sleep at night and had a low sleepiness score of 1.  Recent labs from March 04, 2018 showed total cholesterol 218, HDL 60, LDL 142 and triglycerides 81.  She is not on statin therapy.  Despite being on a stable inhaler does she reports some worsening of her symptoms which could suggest ischemia.  EKG shows sinus rhythm at 67 with nonspecific ST and T wave changes.  06/10/2018  Mrs. Hojnacki returns today for follow-up.  She underwent nuclear stress testing which was negative for ischemia and showed an LVEF of 80%.  She reportedly took atorvastatin for short period of time however when she was visiting [place] with her daughter she had swelling in her feet.  She stopped the medication and the swelling improved.  I suspect however, this is more likely related to environment, salt air, eating more sodium, etc.  She said she be willing to retry her statin.  Repeat lipid profile 8 days ago showed LDL 128 which is mildly improved indicating that she has had some benefit from the medication.  He denies any further chest pain  PMHx:  Past Medical History:  Diagnosis Date  . HTN (hypertension)     No past surgical history on file.  FAMHx:  Family History  Problem Relation Age of Onset  . COPD Mother     . Heart failure Mother   . CAD Sister   . Heart disease Maternal Grandmother   . Cancer Maternal Grandfather   . COPD Paternal Grandfather      SOCHx:   reports that she quit smoking about 14 years ago. Her smoking use included cigarettes. She has never used smokeless tobacco. She reports that she does not drink alcohol or use drugs.  ALLERGIES:  No Known Allergies  ROS: Pertinent items noted in HPI and remainder of comprehensive ROS otherwise negative.  HOME MEDS: Current Outpatient Medications on File Prior to Visit  Medication Sig Dispense Refill  . Fluticasone-Umeclidin-Vilant (TRELEGY ELLIPTA) 100-62.5-25 MCG/INH AEPB Inhale 1 puff into the lungs.    Marland Kitchen losartan-hydrochlorothiazide (HYZAAR) 100-25 MG tablet Take 1 tablet by mouth daily.    . montelukast (SINGULAIR) 10 MG tablet Take 10 mg by mouth at bedtime.   0  . PROAIR HFA 108 (90 Base) MCG/ACT inhaler INHALE 2 PUFFS INTO THE LUNGS EVERY 6 (SIX) HOURS AS NEEDED FOR WHEEZING OR SHORTNESS OF BREATH. 8.5 g 5   No current facility-administered medications on file prior to visit.     LABS/IMAGING: No results found for this or any previous visit (from the past 48 hour(s)). No results found.  LIPID PANEL:    Component Value Date/Time   CHOL 223 (H) 06/02/2018 1024   TRIG 84 06/02/2018  1024   HDL 78 06/02/2018 1024   CHOLHDL 2.9 06/02/2018 1024   LDLCALC 128 (H) 06/02/2018 1024    WEIGHTS: Wt Readings from Last 3 Encounters:  06/10/18 137 lb 6.4 oz (62.3 kg)  03/25/18 136 lb (61.7 kg)  03/23/18 136 lb 6.4 oz (61.9 kg)    VITALS: BP 118/78   Pulse (!) 55   Ht 5\' 1"  (1.549 m)   Wt 137 lb 6.4 oz (62.3 kg)   BMI 25.96 kg/m   EXAM: Deferred  EKG: Sinus bradycardia at 55 - personally reviewed  ASSESSMENT: 1. Progressive chest pain and dyspnea-possibly angina -low risk Myoview stress test, LVEF 80% (05/2018) 2. Abnormal EKG with nonspecific ST and T wave changes 3. COPD 4. Family history of premature  coronary disease in her sister 115. Uncontrolled dyslipidemia  PLAN: 1.  Mrs. Orson AloeHenderson had a low risk stress test and should be okay to go ahead and start more exercise and activity.  Home pulmonary rehabilitation would be excellent for her and she complains about leg weakness and shortness of breath with exertion however this is likely due to inactivity.  Her cholesterol could be better and she thought she had some side effects related to statin.  This is somewhat atypical I like for her to retry the medication to see if her symptoms could back again.  If not she should remain on it and have a repeat lipid profile in 3 to 6 months.  Follow-up annually or sooner as necessary.  Chrystie NoseKenneth C. Etana Beets, MD, [facility]ACC, FACP  [place]  [facility]  Medical Director of the Advanced Lipid Disorders &  Cardiovascular Risk Reduction Clinic Diplomate of the American Board of Clinical Lipidology Attending Cardiologist  Direct Dial: [phone]  Fax: [phone]  Website:  [email or web address]  Addisen Chappelle C Ranulfo Kall 06/10/2018, 9:35 AM

## 2018-06-10 NOTE — Patient Instructions (Addendum)
Dr. Rennis GoldenHilty suggests that you resume atorvastatin (lipitor). Please notify our office if you continue to take the medication. If you are taking it, Dr. Rennis GoldenHilty would like you to have a repeat lipid panel in 3-6 months.  Your physician wants you to follow-up in: ONE YEAR with Dr. Rennis GoldenHilty. You will receive a reminder letter in the mail two months in advance. If you don't receive a letter, please call our office to schedule the follow-up appointment.

## 2018-07-30 DIAGNOSIS — H04123 Dry eye syndrome of bilateral lacrimal glands: Secondary | ICD-10-CM | POA: Diagnosis not present

## 2018-09-16 DIAGNOSIS — Z23 Encounter for immunization: Secondary | ICD-10-CM | POA: Diagnosis not present

## 2018-09-16 DIAGNOSIS — I10 Essential (primary) hypertension: Secondary | ICD-10-CM | POA: Diagnosis not present

## 2018-09-16 DIAGNOSIS — Z6826 Body mass index (BMI) 26.0-26.9, adult: Secondary | ICD-10-CM | POA: Diagnosis not present

## 2018-09-16 DIAGNOSIS — J449 Chronic obstructive pulmonary disease, unspecified: Secondary | ICD-10-CM | POA: Diagnosis not present

## 2018-09-16 DIAGNOSIS — I700 Atherosclerosis of aorta: Secondary | ICD-10-CM | POA: Diagnosis not present

## 2018-09-16 DIAGNOSIS — D692 Other nonthrombocytopenic purpura: Secondary | ICD-10-CM | POA: Diagnosis not present

## 2018-09-16 DIAGNOSIS — E7800 Pure hypercholesterolemia, unspecified: Secondary | ICD-10-CM | POA: Diagnosis not present

## 2018-09-21 ENCOUNTER — Telehealth (HOSPITAL_COMMUNITY): Payer: Self-pay | Admitting: *Deleted

## 2018-09-21 NOTE — Telephone Encounter (Signed)
Received referral for this pt to participate in pulmonary rehab by Dr. Wylene Simmer.  Diagnosis is COPD Stage III.  PFT completed in 2018 do not have post bronch readings.  Will be unable to substantiate COPD diagnosis per Medicare guidelines for Pulmonary rehab.  Pt referred to pulmonary rehab in 2018.  Pulmonary MD was asked for different diagnosis than COPD due to the PFT not qualifying pt to participate.  It is noted in second referral for pulmonary rehab that she could not afford copay. Pt is also seen by Dr. Vassie Loll who also wanted pt to participate in pulmonary rehab.  Pt seen by Dr. Rennis Golden and is cleared to participate in pulmonary rehab.  Will send to Dr. Wylene Simmer pulmonary rehab referral/order form to complete.  Will need diagnosis of emphysema due to no post bronch on most recent PFT.  Will verify insurance benefits and eligibility. Once MD order is received, pt will be contacted and scheduled.  Will refer to support staff for follow up. Alanson Aly, BSN Cardiac and Emergency planning/management officer

## 2018-09-28 DIAGNOSIS — R109 Unspecified abdominal pain: Secondary | ICD-10-CM | POA: Diagnosis not present

## 2018-09-28 DIAGNOSIS — I10 Essential (primary) hypertension: Secondary | ICD-10-CM | POA: Diagnosis not present

## 2018-09-28 DIAGNOSIS — Z6826 Body mass index (BMI) 26.0-26.9, adult: Secondary | ICD-10-CM | POA: Diagnosis not present

## 2018-10-01 DIAGNOSIS — R195 Other fecal abnormalities: Secondary | ICD-10-CM | POA: Diagnosis not present

## 2018-11-05 ENCOUNTER — Encounter (HOSPITAL_COMMUNITY): Payer: Self-pay | Admitting: Emergency Medicine

## 2018-11-05 ENCOUNTER — Emergency Department (HOSPITAL_COMMUNITY): Payer: Medicare HMO

## 2018-11-05 ENCOUNTER — Other Ambulatory Visit: Payer: Self-pay

## 2018-11-05 ENCOUNTER — Emergency Department (HOSPITAL_COMMUNITY)
Admission: EM | Admit: 2018-11-05 | Discharge: 2018-11-05 | Disposition: A | Discharge status: Home / Self Care | Payer: Medicare HMO | Attending: Emergency Medicine | Admitting: Emergency Medicine

## 2018-11-05 DIAGNOSIS — R0902 Hypoxemia: Secondary | ICD-10-CM | POA: Diagnosis not present

## 2018-11-05 DIAGNOSIS — M5416 Radiculopathy, lumbar region: Secondary | ICD-10-CM | POA: Insufficient documentation

## 2018-11-05 DIAGNOSIS — M5410 Radiculopathy, site unspecified: Secondary | ICD-10-CM

## 2018-11-05 DIAGNOSIS — Z87891 Personal history of nicotine dependence: Secondary | ICD-10-CM | POA: Diagnosis not present

## 2018-11-05 DIAGNOSIS — J449 Chronic obstructive pulmonary disease, unspecified: Secondary | ICD-10-CM | POA: Insufficient documentation

## 2018-11-05 DIAGNOSIS — R202 Paresthesia of skin: Secondary | ICD-10-CM | POA: Diagnosis not present

## 2018-11-05 DIAGNOSIS — I10 Essential (primary) hypertension: Secondary | ICD-10-CM | POA: Diagnosis not present

## 2018-11-05 DIAGNOSIS — R42 Dizziness and giddiness: Secondary | ICD-10-CM | POA: Diagnosis not present

## 2018-11-05 DIAGNOSIS — R200 Anesthesia of skin: Secondary | ICD-10-CM | POA: Diagnosis not present

## 2018-11-05 HISTORY — DX: Chronic obstructive pulmonary disease, unspecified: J44.9

## 2018-11-05 LAB — PROTIME-INR
INR: 0.97
PROTHROMBIN TIME: 12.8 s (ref 11.4–15.2)

## 2018-11-05 LAB — URINALYSIS, ROUTINE W REFLEX MICROSCOPIC
Bilirubin Urine: NEGATIVE
Glucose, UA: NEGATIVE mg/dL
HGB URINE DIPSTICK: NEGATIVE
Ketones, ur: NEGATIVE mg/dL
Nitrite: NEGATIVE
Protein, ur: NEGATIVE mg/dL
Specific Gravity, Urine: 1.014 (ref 1.005–1.030)
pH: 5 (ref 5.0–8.0)

## 2018-11-05 LAB — CBC
HCT: 40.8 % (ref 36.0–46.0)
Hemoglobin: 12.8 g/dL (ref 12.0–15.0)
MCH: 30 pg (ref 26.0–34.0)
MCHC: 31.4 g/dL (ref 30.0–36.0)
MCV: 95.8 fL (ref 80.0–100.0)
PLATELETS: 241 10*3/uL (ref 150–400)
RBC: 4.26 MIL/uL (ref 3.87–5.11)
RDW: 13 % (ref 11.5–15.5)
WBC: 10.1 10*3/uL (ref 4.0–10.5)
nRBC: 0 % (ref 0.0–0.2)

## 2018-11-05 LAB — DIFFERENTIAL
Abs Immature Granulocytes: 0.03 10*3/uL (ref 0.00–0.07)
BASOS PCT: 1 %
Basophils Absolute: 0.1 10*3/uL (ref 0.0–0.1)
Eosinophils Absolute: 0.1 10*3/uL (ref 0.0–0.5)
Eosinophils Relative: 1 %
Immature Granulocytes: 0 %
Lymphocytes Relative: 15 %
Lymphs Abs: 1.5 10*3/uL (ref 0.7–4.0)
Monocytes Absolute: 0.7 10*3/uL (ref 0.1–1.0)
Monocytes Relative: 7 %
NEUTROS PCT: 76 %
Neutro Abs: 7.6 10*3/uL (ref 1.7–7.7)

## 2018-11-05 LAB — COMPREHENSIVE METABOLIC PANEL
ALT: 49 U/L — ABNORMAL HIGH (ref 0–44)
AST: 42 U/L — ABNORMAL HIGH (ref 15–41)
Albumin: 3.7 g/dL (ref 3.5–5.0)
Alkaline Phosphatase: 112 U/L (ref 38–126)
Anion gap: 13 (ref 5–15)
BUN: 24 mg/dL — ABNORMAL HIGH (ref 8–23)
CO2: 23 mmol/L (ref 22–32)
Calcium: 9.1 mg/dL (ref 8.9–10.3)
Chloride: 102 mmol/L (ref 98–111)
Creatinine, Ser: 1.15 mg/dL — ABNORMAL HIGH (ref 0.44–1.00)
GFR calc Af Amer: 53 mL/min — ABNORMAL LOW (ref >60)
GFR calc non Af Amer: 46 mL/min — ABNORMAL LOW (ref >60)
Glucose, Bld: 93 mg/dL (ref 70–99)
Potassium: 3.9 mmol/L (ref 3.5–5.1)
Sodium: 138 mmol/L (ref 135–145)
Total Bilirubin: 0.7 mg/dL (ref 0.3–1.2)
Total Protein: 6.6 g/dL (ref 6.5–8.1)

## 2018-11-05 LAB — I-STAT CHEM 8, ED
BUN: 26 mg/dL — ABNORMAL HIGH (ref 8–23)
Calcium, Ion: 1.1 mmol/L — ABNORMAL LOW (ref 1.15–1.40)
Chloride: 101 mmol/L (ref 98–111)
Creatinine, Ser: 1.1 mg/dL — ABNORMAL HIGH (ref 0.44–1.00)
Glucose, Bld: 89 mg/dL (ref 70–99)
HEMATOCRIT: 40 % (ref 36.0–46.0)
Hemoglobin: 13.6 g/dL (ref 12.0–15.0)
Potassium: 3.8 mmol/L (ref 3.5–5.1)
Sodium: 137 mmol/L (ref 135–145)
TCO2: 27 mmol/L (ref 22–32)

## 2018-11-05 LAB — APTT: aPTT: 32 seconds (ref 24–36)

## 2018-11-05 MED ORDER — PREDNISONE 10 MG PO TABS: 40.0000 mg | ORAL_TABLET | Freq: Every day | ORAL | 0 refills | Status: AC

## 2018-11-05 NOTE — ED Provider Notes (Signed)
   Face-to-face evaluation   History: She presents for evaluation of intermittent numbness in her left arm, for 1 week.  It has been happening every morning lasting about a half an hour.  Today it lasted longer and was accompanied with a sensation of numbness in the left perioral region.  Her husband thinks her left face is more swollen, and he and his wife are worried about a stroke.  She denies headache, focal weakness or prior similar problems.  Physical exam: Alert, calm, cooperative.  No dysarthria or aphasia.  Intact light touch sensation both hands.  Questionable left facial weakness which is very mild.  No perioral dysthesia.  Medical screening examination/treatment/procedure(s) were conducted as a shared visit with non-physician practitioner(s) and myself.  I personally evaluated the patient during the encounter     Mancel BaleWentz, Kya Mayfield, MD 11/05/18 2337

## 2018-11-05 NOTE — Discharge Instructions (Signed)
Your kidney function labs were slightly high today.  You will need to have this rechecked by your primary care provider. Take the steroid burst as directed. Return to ED for worsening symptoms, increased numbness or weakness in your legs, injuries or falls, headache, vision changes.

## 2018-11-05 NOTE — ED Provider Notes (Signed)
[facility] EMERGENCY DEPARTMENT Provider Note   CSN: [id number] Arrival date & time: 11/05/18  1532     History   Chief Complaint Chief Complaint  Patient presents with  . Numbness    HPI Lisa Kaufman is a 77 y.o. female with a past medical history of COPD, hypertension, hyperlipidemia, who presents to ED for lip numbness on the left side of her face that occurred approximately 11 hours ago when she woke up.  States that the symptoms gradually resolved.  For the past week, she has had a numbness and tingling sensation of her left arm and wrist which usually improves 30 minutes after waking up.  However, she states that the symptoms have lingered on for longer today.  She does note some tingling in her fingertips still.  However, she has never had the tingling in her lips before.  Husband at bedside states that the left side of her "cheek looks a little more swollen than usual."  Otherwise denies any facial asymmetry.  Patient cannot recall any incident 1 week ago that may have caused her arm numbness.  Denies any injuries or falls, anticoagulant use, weakness in legs changes from baseline, fever, neck pain, shortness of breath, chest pain.  HPI  Past Medical History:  Diagnosis Date  . COPD (chronic obstructive pulmonary disease) (HCC)     There are no active problems to display for this patient.   History reviewed. No pertinent surgical history.   OB History   No obstetric history on file.      Home Medications    Prior to Admission medications   Medication Sig Start Date End Date Taking? Authorizing Provider  predniSONE (DELTASONE) 10 MG tablet Take 4 tablets (40 mg total) by mouth daily for 5 days. 11/05/18 11/10/18  Dietrich Pates, PA-C    Family History No family history on file.  Social History Social History   Tobacco Use  . Smoking status: Former Smoker  Substance Use Topics  . Alcohol use: Yes  . Drug use: Never     Allergies     Patient has no allergy information on record.   Review of Systems Review of Systems  Constitutional: Negative for appetite change, chills and fever.  HENT: Negative for ear pain, rhinorrhea, sneezing and sore throat.   Eyes: Negative for photophobia and visual disturbance.  Respiratory: Negative for cough, chest tightness, shortness of breath and wheezing.   Cardiovascular: Negative for chest pain and palpitations.  Gastrointestinal: Negative for abdominal pain, blood in stool, constipation, diarrhea, nausea and vomiting.  Genitourinary: Negative for dysuria, hematuria and urgency.  Musculoskeletal: Negative for myalgias.  Skin: Negative for rash.  Neurological: Positive for numbness. Negative for dizziness, weakness and light-headedness.     Physical Exam Updated Vital Signs BP 133/71   Pulse 68   Temp 97.7 F (36.5 C) (Oral)   Resp (!) 23   Ht 5\' 1"  (1.549 m)   Wt 61.2 kg   SpO2 100%   BMI 25.51 kg/m   Physical Exam Vitals signs and nursing note reviewed.  Constitutional:      General: She is not in acute distress.    Appearance: She is well-developed.  HENT:     Head: Normocephalic and atraumatic.     Nose: Nose normal.  Eyes:     General: No scleral icterus.       Right eye: No discharge.        Left eye: No discharge.  Extraocular Movements: Extraocular movements intact.     Conjunctiva/sclera: Conjunctivae normal.     Pupils: Pupils are equal, round, and reactive to light.  Neck:     Musculoskeletal: Normal range of motion and neck supple.  Cardiovascular:     Rate and Rhythm: Normal rate and regular rhythm.     Heart sounds: Normal heart sounds. No murmur. No friction rub. No gallop.   Pulmonary:     Effort: Pulmonary effort is normal. No respiratory distress.     Breath sounds: Normal breath sounds.  Abdominal:     General: Bowel sounds are normal. There is no distension.     Palpations: Abdomen is soft.     Tenderness: There is no abdominal  tenderness. There is no guarding.  Musculoskeletal: Normal range of motion.  Skin:    General: Skin is warm and dry.     Findings: No rash.  Neurological:     Mental Status: She is alert and oriented to person, place, and time.     Cranial Nerves: No cranial nerve deficit.     Sensory: No sensory deficit.     Motor: No weakness or abnormal muscle tone.     Coordination: Coordination normal.     Comments: Pupils reactive. No facial asymmetry noted. Cranial nerves appear grossly intact. Sensation intact to light touch on face, BUE and BLE. Strength 5/5 in BUE and BLE.  Reports paresthesias of left fingertips.      ED Treatments / Results  Labs (all labs ordered are listed, but only abnormal results are displayed) Labs Reviewed  COMPREHENSIVE METABOLIC PANEL - Abnormal; Notable for the following components:      Result Value   BUN 24 (*)    Creatinine, Ser 1.15 (*)    AST 42 (*)    ALT 49 (*)    GFR calc non Af Amer 46 (*)    GFR calc Af Amer 53 (*)    All other components within normal limits  URINALYSIS, ROUTINE W REFLEX MICROSCOPIC - Abnormal; Notable for the following components:   Leukocytes, UA SMALL (*)    Bacteria, UA RARE (*)    All other components within normal limits  I-STAT CHEM 8, ED - Abnormal; Notable for the following components:   BUN 26 (*)    Creatinine, Ser 1.10 (*)    Calcium, Ion 1.10 (*)    All other components within normal limits  PROTIME-INR  APTT  CBC  DIFFERENTIAL    EKG None  Radiology Mr Brain Wo Contrast  Result Date: 11/05/2018 CLINICAL DATA:  77 y/o F; numbness and tingling of the left arm for a week. EXAM: MRI HEAD WITHOUT CONTRAST TECHNIQUE: Multiplanar, multiecho pulse sequences of the brain and surrounding structures were obtained without intravenous contrast. COMPARISON:  None. FINDINGS: Brain: No acute infarction, hemorrhage, hydrocephalus, extra-axial collection or mass lesion. Few nonspecific T2 FLAIR hyperintensities in  subcortical and periventricular white matter are compatible with mild chronic microvascular ischemic changes for age. Mild volume loss of the brain. Vascular: Normal flow voids. Skull and upper cervical spine: C1-2 left lateral joint effusion, likely degenerative. Sinuses/Orbits: Negative. Other: Bilateral intra-ocular lens replacement. IMPRESSION: 1. No acute intracranial abnormality identified. 2. Mild chronic microvascular ischemic changes and volume loss of the brain. Electronically Signed   By: Mitzi HansenLance  Furusawa-Stratton M.D.   On: 11/05/2018 19:00    Procedures Procedures (including critical care time)  Medications Ordered in ED Medications - No data to display   Initial Impression /  Assessment and Plan / ED Course  I have reviewed the triage vital signs and the nursing notes.  Pertinent labs & imaging results that were available during my care of the patient were reviewed by me and considered in my medical decision making (see chart for details).     77 year old female with past medical history of COPD, hypertension, hyperlipidemia presents to ED for lip numbness on left side of her face that occurred approximately 11 hours ago when she woke up.  States the symptoms gradually resolved.  She has had this tingling and numbness sensation in her left arm and wrist which usually improves after 30 minutes after waking up for the past week.  However, she states the specific symptoms have lingered on for longer today.  At the time of my evaluation she did have some tingling in her fingertips of her left hand.  Husband at bedside states that he feels like her left cheek is more swollen but unsure if she has an actual facial droop. On physical exam she is overall well appearing. No sensory deficit noted. Denies any injuries or falls, weakness in the legs, fever or anticoagulant use.  Lab work including CBC, PT/INR, PTT, unremarkable. Cr slightly elevated to 1.15. MR of the brain shows no acute findings  there are some chronic changes noted. Patient remains HDS here. Suspect that symptoms could be radicular in nature based on her unremarkable work-up and imaging.  Will discharge home with prednisone burst and PCP follow-up.  Will advise her to return to ED for any severe worsening symptoms. Patient discussed with and seen by my attending, Dr. Effie ShyWentz.  Patient is hemodynamically stable, in NAD, and able to ambulate in the ED. Evaluation does not show pathology that would require ongoing emergent intervention or inpatient treatment. I explained the diagnosis to the patient. Pain has been managed and has no complaints prior to discharge. Patient is comfortable with above plan and is stable for discharge at this time. All questions were answered prior to disposition. Strict return precautions for returning to the ED were discussed. Encouraged follow up with PCP.    Portions of this note were generated with Scientist, clinical (histocompatibility and immunogenetics)Dragon dictation software. Dictation errors may occur despite best attempts at proofreading.  Final Clinical Impressions(s) / ED Diagnoses   Final diagnoses:  Radiculopathy, unspecified spinal region    ED Discharge Orders         Ordered    predniSONE (DELTASONE) 10 MG tablet  Daily     11/05/18 1926           Dietrich PatesKhatri, Dayana Dalporto, PA-C 11/05/18 1930    Mancel BaleWentz, Elliott, MD 11/05/18 2336

## 2018-11-05 NOTE — ED Notes (Signed)
Patient Alert and oriented to baseline. Stable and ambulatory to baseline. Patient verbalized understanding of the discharge instructions.  Patient belongings were taken by the patient.   

## 2018-11-05 NOTE — ED Triage Notes (Signed)
Pt presents to ED from home via [facility]. Pt complains of numbness and tingling of the left arm for a week. Pt called her MD and was told to come here. Pt also complains of left lip numbness. Pt LKW 4:30 am. Pt states she has weakness in her legs at baseline.

## 2018-11-08 ENCOUNTER — Encounter: Payer: Self-pay | Admitting: Internal Medicine

## 2019-01-18 DIAGNOSIS — I10 Essential (primary) hypertension: Secondary | ICD-10-CM | POA: Diagnosis not present

## 2019-01-18 DIAGNOSIS — S39012A Strain of muscle, fascia and tendon of lower back, initial encounter: Secondary | ICD-10-CM | POA: Diagnosis not present

## 2019-01-18 DIAGNOSIS — Z6826 Body mass index (BMI) 26.0-26.9, adult: Secondary | ICD-10-CM | POA: Diagnosis not present

## 2019-01-28 DIAGNOSIS — H524 Presbyopia: Secondary | ICD-10-CM | POA: Diagnosis not present

## 2019-01-28 DIAGNOSIS — Z961 Presence of intraocular lens: Secondary | ICD-10-CM | POA: Diagnosis not present

## 2019-01-28 DIAGNOSIS — H04123 Dry eye syndrome of bilateral lacrimal glands: Secondary | ICD-10-CM | POA: Diagnosis not present

## 2019-02-04 IMAGING — NM NM MISC PROCEDURE
9 series · 54 of 54 positions shown · non-contrast
Comparison: none

[Series 1: wbr rest · 6.40mm/px · 6 of 64 frames shown]
[frame 6/64]
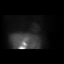
[frame 16/64]
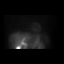
[frame 27/64]
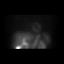
[frame 38/64]
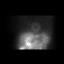
[frame 48/64]
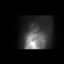
[frame 59/64]
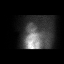

[Series 1: wbr_r-proj_st wbr rest · 6.40mm/px · 6 of 64 frames shown]
[frame 6/64]
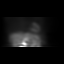
[frame 16/64]
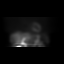
[frame 27/64]
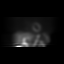
[frame 38/64]
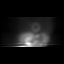
[frame 48/64]
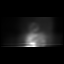
[frame 59/64]
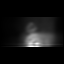

[Series 1: rest sax · 6.4mm · 6.40mm/px · 6 of 21 frames shown]
[frame 2/21]
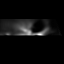
[frame 6/21]
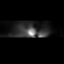
[frame 9/21]
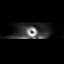
[frame 13/21]
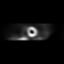
[frame 16/21]
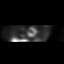
[frame 20/21]
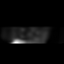

[Series 2: wbr_s-proj_st wbr stress-gsp · 6.40mm/px · 6 of 512 frames shown]
[frame 43/512]
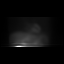
[frame 128/512]
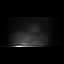
[frame 214/512]
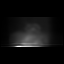
[frame 299/512]
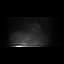
[frame 384/512]
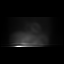
[frame 470/512]
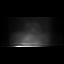

[Series 2: stress sax gs · 6.4mm · 6.40mm/px · 6 of 152 frames shown]
[frame 13/152]
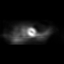
[frame 38/152]
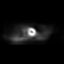
[frame 64/152]
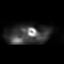
[frame 89/152]
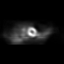
[frame 114/152]
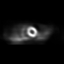
[frame 140/152]
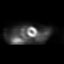

[Series 2: wbr stress-gsp · 6.40mm/px · 6 of 512 frames shown]
[frame 43/512]
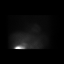
[frame 128/512]
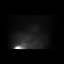
[frame 214/512]
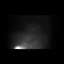
[frame 299/512]
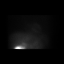
[frame 384/512]
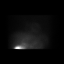
[frame 470/512]
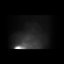

[Series 3: wbr_s-proj_st wbr stress-sum-em · 6.40mm/px · 6 of 64 frames shown]
[frame 6/64]
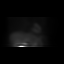
[frame 16/64]
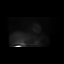
[frame 27/64]
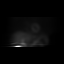
[frame 38/64]
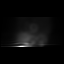
[frame 48/64]
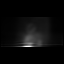
[frame 59/64]
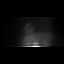

[Series 3: stress sax · 6.4mm · 6.40mm/px · 6 of 19 frames shown]
[frame 2/19]
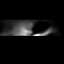
[frame 5/19]
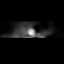
[frame 8/19]
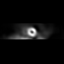
[frame 11/19]
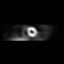
[frame 14/19]
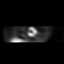
[frame 18/19]
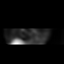

[Series 3: wbr stress-sum-em · 6.40mm/px · 6 of 64 frames shown]
[frame 6/64]
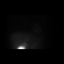
[frame 16/64]
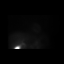
[frame 27/64]
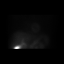
[frame 38/64]
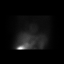
[frame 48/64]
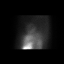
[frame 59/64]
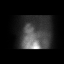

[54 of 54 positions shown; findings below may reference images not displayed]

Canned report from images found in remote index.

Refer to host system for actual result text.

## 2019-03-11 DIAGNOSIS — I10 Essential (primary) hypertension: Secondary | ICD-10-CM | POA: Diagnosis not present

## 2019-03-11 DIAGNOSIS — E7800 Pure hypercholesterolemia, unspecified: Secondary | ICD-10-CM | POA: Diagnosis not present

## 2019-03-15 ENCOUNTER — Other Ambulatory Visit: Payer: Self-pay | Admitting: Internal Medicine

## 2019-03-15 NOTE — Telephone Encounter (Signed)
Atorvastatin 40 mg refilled. 

## 2019-03-18 DIAGNOSIS — S39012A Strain of muscle, fascia and tendon of lower back, initial encounter: Secondary | ICD-10-CM | POA: Diagnosis not present

## 2019-03-18 DIAGNOSIS — E7800 Pure hypercholesterolemia, unspecified: Secondary | ICD-10-CM | POA: Diagnosis not present

## 2019-03-18 DIAGNOSIS — Z1331 Encounter for screening for depression: Secondary | ICD-10-CM | POA: Diagnosis not present

## 2019-03-18 DIAGNOSIS — I700 Atherosclerosis of aorta: Secondary | ICD-10-CM | POA: Diagnosis not present

## 2019-03-18 DIAGNOSIS — I10 Essential (primary) hypertension: Secondary | ICD-10-CM | POA: Diagnosis not present

## 2019-03-18 DIAGNOSIS — Z1339 Encounter for screening examination for other mental health and behavioral disorders: Secondary | ICD-10-CM | POA: Diagnosis not present

## 2019-03-18 DIAGNOSIS — Z0000 Encounter for general adult medical examination without abnormal findings: Secondary | ICD-10-CM | POA: Diagnosis not present

## 2019-03-18 DIAGNOSIS — D692 Other nonthrombocytopenic purpura: Secondary | ICD-10-CM | POA: Diagnosis not present

## 2019-03-18 DIAGNOSIS — J449 Chronic obstructive pulmonary disease, unspecified: Secondary | ICD-10-CM | POA: Diagnosis not present

## 2019-06-12 ENCOUNTER — Other Ambulatory Visit: Payer: Self-pay | Admitting: Internal Medicine

## 2019-06-22 ENCOUNTER — Other Ambulatory Visit: Payer: Self-pay | Admitting: Internal Medicine

## 2019-07-12 ENCOUNTER — Other Ambulatory Visit: Payer: Self-pay | Admitting: Internal Medicine

## 2019-07-12 DIAGNOSIS — R921 Mammographic calcification found on diagnostic imaging of breast: Secondary | ICD-10-CM

## 2019-07-20 ENCOUNTER — Other Ambulatory Visit: Payer: Self-pay

## 2019-07-20 ENCOUNTER — Ambulatory Visit
Admission: RE | Admit: 2019-07-20 | Discharge: 2019-07-20 | Disposition: A | Discharge status: Home / Self Care | Payer: Medicare HMO | Source: Ambulatory Visit | Attending: Internal Medicine | Admitting: Internal Medicine

## 2019-07-20 DIAGNOSIS — R921 Mammographic calcification found on diagnostic imaging of breast: Secondary | ICD-10-CM

## 2019-09-20 DIAGNOSIS — Z9114 Patient's other noncompliance with medication regimen: Secondary | ICD-10-CM | POA: Diagnosis not present

## 2019-09-20 DIAGNOSIS — E7800 Pure hypercholesterolemia, unspecified: Secondary | ICD-10-CM | POA: Diagnosis not present

## 2019-09-20 DIAGNOSIS — M545 Low back pain: Secondary | ICD-10-CM | POA: Diagnosis not present

## 2019-09-20 DIAGNOSIS — J449 Chronic obstructive pulmonary disease, unspecified: Secondary | ICD-10-CM | POA: Diagnosis not present

## 2019-09-20 DIAGNOSIS — I700 Atherosclerosis of aorta: Secondary | ICD-10-CM | POA: Diagnosis not present

## 2019-09-20 DIAGNOSIS — I10 Essential (primary) hypertension: Secondary | ICD-10-CM | POA: Diagnosis not present

## 2019-09-23 DIAGNOSIS — E7800 Pure hypercholesterolemia, unspecified: Secondary | ICD-10-CM | POA: Diagnosis not present

## 2019-09-23 DIAGNOSIS — Z23 Encounter for immunization: Secondary | ICD-10-CM | POA: Diagnosis not present

## 2019-12-07 ENCOUNTER — Other Ambulatory Visit: Payer: Self-pay | Admitting: Internal Medicine

## 2020-01-18 ENCOUNTER — Other Ambulatory Visit: Payer: Self-pay | Admitting: Internal Medicine

## 2020-01-26 DIAGNOSIS — M940 Chondrocostal junction syndrome [Tietze]: Secondary | ICD-10-CM | POA: Diagnosis not present

## 2020-01-26 DIAGNOSIS — I950 Idiopathic hypotension: Secondary | ICD-10-CM | POA: Diagnosis not present

## 2020-01-26 DIAGNOSIS — Z79899 Other long term (current) drug therapy: Secondary | ICD-10-CM | POA: Diagnosis not present

## 2020-01-26 DIAGNOSIS — R2240 Localized swelling, mass and lump, unspecified lower limb: Secondary | ICD-10-CM | POA: Diagnosis not present

## 2020-01-26 DIAGNOSIS — R001 Bradycardia, unspecified: Secondary | ICD-10-CM | POA: Diagnosis not present

## 2020-01-26 DIAGNOSIS — Z7982 Long term (current) use of aspirin: Secondary | ICD-10-CM | POA: Diagnosis not present

## 2020-01-26 DIAGNOSIS — I10 Essential (primary) hypertension: Secondary | ICD-10-CM | POA: Diagnosis not present

## 2020-01-26 DIAGNOSIS — I249 Acute ischemic heart disease, unspecified: Secondary | ICD-10-CM | POA: Diagnosis not present

## 2020-01-26 DIAGNOSIS — M25512 Pain in left shoulder: Secondary | ICD-10-CM | POA: Diagnosis not present

## 2020-01-26 DIAGNOSIS — Z87891 Personal history of nicotine dependence: Secondary | ICD-10-CM | POA: Diagnosis not present

## 2020-01-26 DIAGNOSIS — R079 Chest pain, unspecified: Secondary | ICD-10-CM | POA: Diagnosis not present

## 2020-01-26 DIAGNOSIS — J441 Chronic obstructive pulmonary disease with (acute) exacerbation: Secondary | ICD-10-CM | POA: Diagnosis not present

## 2020-01-27 DIAGNOSIS — R079 Chest pain, unspecified: Secondary | ICD-10-CM | POA: Diagnosis not present

## 2020-02-15 ENCOUNTER — Other Ambulatory Visit: Payer: Self-pay | Admitting: Internal Medicine

## 2020-02-28 ENCOUNTER — Telehealth (INDEPENDENT_AMBULATORY_CARE_PROVIDER_SITE_OTHER): Payer: Medicare HMO | Admitting: Physician Assistant

## 2020-02-28 ENCOUNTER — Encounter: Payer: Self-pay | Admitting: Physician Assistant

## 2020-02-28 VITALS — Ht 61.0 in | Wt 136.0 lb

## 2020-02-28 DIAGNOSIS — J449 Chronic obstructive pulmonary disease, unspecified: Secondary | ICD-10-CM

## 2020-02-28 DIAGNOSIS — R0789 Other chest pain: Secondary | ICD-10-CM | POA: Diagnosis not present

## 2020-02-28 DIAGNOSIS — I10 Essential (primary) hypertension: Secondary | ICD-10-CM | POA: Diagnosis not present

## 2020-02-28 NOTE — Progress Notes (Signed)
Virtual Visit via Telephone Note   This visit type was conducted due to national recommendations for restrictions regarding the COVID-19 Pandemic (e.g. social distancing) in an effort to limit this patient's exposure and mitigate transmission in our community.  Due to her co-morbid illnesses, this patient is at least at moderate risk for complications without adequate follow up.  This format is felt to be most appropriate for this patient at this time.  The patient did not have access to video technology/had technical difficulties with video requiring transitioning to audio format only (telephone).  All issues noted in this document were discussed and addressed.  No physical exam could be performed with this format.  Please refer to the patient's chart for her  consent to telehealth for [facility].   The patient was identified using 2 identifiers.  Date:  03/01/2020   ID:  Lisa Kaufman, DOB [birth year], MRN [id number]  Patient Location: Home Provider Location: Home  PCP:  Tisovec, Adelfa Koh, MD  Cardiologist:  Chrystie Nose, MD  Electrophysiologist:  None   Evaluation Performed:  Follow-Up Visit  Chief Complaint:  followup  History of Present Illness:    Lisa Kaufman is a 79 y.o. female with former tobacco use, hypertension and COPD.  Patient was previously seen in 2019 for evaluation of chest pain.  Myoview obtained at the time was negative for ischemia, EF 80%.  Her last office visit with Dr. Rennis Golden was on 06/10/2018, she has not been seen since.  Mrs. Moehring presents today for cardiology office follow-up.  She says she went to [facility] ED in [place] on 01/26/2020 with chest pain.  She says she noticed the chest pain every time she turned her body, however chest pain improved when she straightened up her upper torso.  She had lab work done and EKG at the emergency room and was told that she likely have musculoskeletal pain.  She denies any exertional  type of chest discomfort.  The symptom of the chest pain does sounds atypical.  We will request ED record.  At this time, I decided to hold off on additional study.  She will need to follow-up in 6 months.  During the meantime, she is aware to contact cardiology if her chest discomfort or does come back or become more related to physical activity.   Past Medical History:  Diagnosis Date  . COPD (chronic obstructive pulmonary disease) (HCC)   . HTN (hypertension)    History reviewed. No pertinent surgical history.   Current Meds  Medication Sig  . atorvastatin (LIPITOR) 40 MG tablet TAKE 1 TABLET (40 MG TOTAL) BY MOUTH DAILY. OFFICE VISIT NEEDED  . losartan-hydrochlorothiazide (HYZAAR) 100-25 MG tablet Take 1 tablet by mouth daily.  . montelukast (SINGULAIR) 10 MG tablet Take 10 mg by mouth at bedtime.   . moxifloxacin (VIGAMOX) 0.5 % ophthalmic solution moxifloxacin 0.5 % eye drops  . PROAIR HFA 108 (90 Base) MCG/ACT inhaler INHALE 2 PUFFS INTO THE LUNGS EVERY 6 (SIX) HOURS AS NEEDED FOR WHEEZING OR SHORTNESS OF BREATH.     Allergies:   Patient has no known allergies.   Social History   Tobacco Use  . Smoking status: Former Smoker    Types: Cigarettes    Quit date: 11/18/2003    Years since quitting: 16.2  . Smokeless tobacco: Never Used  Substance Use Topics  . Alcohol use: Yes  . Drug use: Never     Family Hx: The patient's family history includes  CAD in her sister; COPD in her mother and paternal grandfather; Cancer in her maternal grandfather; Heart disease in her maternal grandmother; Heart failure in her mother.  ROS:   Please see the history of present illness.     All other systems reviewed and are negative.   Prior CV studies:   The following studies were reviewed today:  Myoview 03/25/2018  Nuclear stress EF: 80%.  The left ventricular ejection fraction is hyperdynamic (>65%).  Defect 1: There is a small defect of mild severity present in the apex  location.  The study is normal.  This is a low risk study.   Normal stress nuclear study with borderline ECG changes; images show apical thinning but no ischemia; EF 80 with normal wall motion.   The patient does not have symptoms concerning for COVID-19 infection (fever, chills, cough, or new shortness of breath).   Labs/Other Tests and Data Reviewed:    EKG:  An ECG dated 06/10/2018 was personally reviewed today and demonstrated:  Normal sinus rhythm without significant ST-T wave changes.  Recent Labs: No results found for requested labs within last 8760 hours.   Recent Lipid Panel Lab Results  Component Value Date/Time   CHOL 223 (H) 06/02/2018 10:24 AM   TRIG 84 06/02/2018 10:24 AM   HDL 78 06/02/2018 10:24 AM   CHOLHDL 2.9 06/02/2018 10:24 AM   LDLCALC 128 (H) 06/02/2018 10:24 AM    Wt Readings from Last 3 Encounters:  02/28/20 136 lb (61.7 kg)  11/05/18 135 lb (61.2 kg)  06/10/18 137 lb 6.4 oz (62.3 kg)     Objective:    Vital Signs:  Ht 5\' 1"  (1.549 m)   Wt 136 lb (61.7 kg)   BMI 25.70 kg/m    VITAL SIGNS:  reviewed  ASSESSMENT & PLAN:    1. Atypical chest pain: Appears to be musculoskeletal in nature.  Symptom does not occur with physical activity, however worsens with body rotation.  I do not recommend any further work-up.  We will request record from outside ED  2. Hypertension: Continue on current therapy  3. COPD: No acute exacerbation.  COVID-19 Education: The signs and symptoms of COVID-19 were discussed with the patient and how to seek care for testing (follow up with PCP or arrange E-visit).  The importance of social distancing was discussed today.  Time:   Today, I have spent 10 minutes with the patient with telehealth technology discussing the above problems.     Medication Adjustments/Labs and Tests Ordered: Current medicines are reviewed at length with the patient today.  Concerns regarding medicines are outlined above.   Tests  Ordered: No orders of the defined types were placed in this encounter.   Medication Changes: No orders of the defined types were placed in this encounter.   Follow Up:  Either In Person or Virtual in 6 month(s)  Signed, Almyra Deforest, [place]  03/01/2020 11:21 PM    [place]

## 2020-02-28 NOTE — Patient Instructions (Signed)
Medication Instructions:  Your physician recommends that you continue on your current medications as directed. Please refer to the Current Medication list given to you today.  *If you need a refill on your cardiac medications before your next appointment, please call your pharmacy*  Lab Work: NONE ordered at this time of appointment   If you have labs (blood work) drawn today and your tests are completely normal, you will receive your results only by: . MyChart Message (if you have MyChart) OR . A paper copy in the mail If you have any lab test that is abnormal or we need to change your treatment, we will call you to review the results.  Testing/Procedures: NONE ordered at this time of appointment   Follow-Up: At [facility], you and your health needs are our priority.  As part of our continuing mission to provide you with exceptional heart care, we have created designated Provider Care Teams.  These Care Teams include your primary Cardiologist (physician) and Advanced Practice Providers (APPs -  Physician Assistants and Nurse Practitioners) who all work together to provide you with the care you need, when you need it.  We recommend signing up for the patient portal called "MyChart".  Sign up information is provided on this After Visit Summary.  MyChart is used to connect with patients for Virtual Visits (Telemedicine).  Patients are able to view lab/test results, encounter notes, upcoming appointments, etc.  Non-urgent messages can be sent to your provider as well.   To learn more about what you can do with MyChart, go to [email or web address].    Your next appointment:   6 month(s)  The format for your next appointment:   In Person  Provider:   K. Chad Hilty, MD  Other Instructions   

## 2020-03-01 ENCOUNTER — Encounter: Payer: Self-pay | Admitting: Physician Assistant

## 2020-03-13 DIAGNOSIS — E7800 Pure hypercholesterolemia, unspecified: Secondary | ICD-10-CM | POA: Diagnosis not present

## 2020-03-13 DIAGNOSIS — Z0000 Encounter for general adult medical examination without abnormal findings: Secondary | ICD-10-CM | POA: Diagnosis not present

## 2020-03-13 DIAGNOSIS — I10 Essential (primary) hypertension: Secondary | ICD-10-CM | POA: Diagnosis not present

## 2020-03-15 ENCOUNTER — Encounter: Payer: Self-pay | Admitting: Internal Medicine

## 2020-03-15 NOTE — Telephone Encounter (Signed)
Opened in error

## 2020-03-20 DIAGNOSIS — R82998 Other abnormal findings in urine: Secondary | ICD-10-CM | POA: Diagnosis not present

## 2020-03-20 DIAGNOSIS — M545 Low back pain: Secondary | ICD-10-CM | POA: Diagnosis not present

## 2020-03-20 DIAGNOSIS — E7800 Pure hypercholesterolemia, unspecified: Secondary | ICD-10-CM | POA: Diagnosis not present

## 2020-03-20 DIAGNOSIS — D692 Other nonthrombocytopenic purpura: Secondary | ICD-10-CM | POA: Diagnosis not present

## 2020-03-20 DIAGNOSIS — Z1331 Encounter for screening for depression: Secondary | ICD-10-CM | POA: Diagnosis not present

## 2020-03-20 DIAGNOSIS — I10 Essential (primary) hypertension: Secondary | ICD-10-CM | POA: Diagnosis not present

## 2020-03-20 DIAGNOSIS — Z1339 Encounter for screening examination for other mental health and behavioral disorders: Secondary | ICD-10-CM | POA: Diagnosis not present

## 2020-03-20 DIAGNOSIS — I700 Atherosclerosis of aorta: Secondary | ICD-10-CM | POA: Diagnosis not present

## 2020-03-20 DIAGNOSIS — Z0000 Encounter for general adult medical examination without abnormal findings: Secondary | ICD-10-CM | POA: Diagnosis not present

## 2020-03-20 DIAGNOSIS — J449 Chronic obstructive pulmonary disease, unspecified: Secondary | ICD-10-CM | POA: Diagnosis not present

## 2020-03-21 ENCOUNTER — Other Ambulatory Visit: Payer: Self-pay | Admitting: Internal Medicine

## 2020-03-21 MED ORDER — ATORVASTATIN CALCIUM 40 MG PO TABS: 40.0000 mg | ORAL_TABLET | Freq: Every day | ORAL | 3 refills | Status: AC

## 2020-03-22 DIAGNOSIS — Z1212 Encounter for screening for malignant neoplasm of rectum: Secondary | ICD-10-CM | POA: Diagnosis not present

## 2020-04-18 DIAGNOSIS — J449 Chronic obstructive pulmonary disease, unspecified: Secondary | ICD-10-CM | POA: Diagnosis not present

## 2020-04-18 DIAGNOSIS — I739 Peripheral vascular disease, unspecified: Secondary | ICD-10-CM | POA: Diagnosis not present

## 2020-04-18 DIAGNOSIS — L821 Other seborrheic keratosis: Secondary | ICD-10-CM | POA: Diagnosis not present

## 2020-04-18 DIAGNOSIS — I700 Atherosclerosis of aorta: Secondary | ICD-10-CM | POA: Diagnosis not present

## 2020-04-26 DIAGNOSIS — L820 Inflamed seborrheic keratosis: Secondary | ICD-10-CM | POA: Diagnosis not present

## 2020-04-26 DIAGNOSIS — L821 Other seborrheic keratosis: Secondary | ICD-10-CM | POA: Diagnosis not present

## 2020-05-02 ENCOUNTER — Other Ambulatory Visit (HOSPITAL_COMMUNITY): Payer: Self-pay | Admitting: Internal Medicine

## 2020-05-02 DIAGNOSIS — I739 Peripheral vascular disease, unspecified: Secondary | ICD-10-CM

## 2020-05-03 ENCOUNTER — Other Ambulatory Visit: Payer: Self-pay

## 2020-05-03 ENCOUNTER — Ambulatory Visit (HOSPITAL_COMMUNITY)
Admission: RE | Admit: 2020-05-03 | Discharge: 2020-05-03 | Disposition: A | Discharge status: Home / Self Care | Payer: Medicare HMO | Source: Ambulatory Visit | Attending: Surgery | Admitting: Surgery

## 2020-05-03 DIAGNOSIS — I739 Peripheral vascular disease, unspecified: Secondary | ICD-10-CM | POA: Insufficient documentation
